# Patient Record
Sex: Male | Born: 1990 | Race: Black or African American | Hispanic: No | Marital: Single | Smoking: Current every day smoker
Health system: Southern US, Community
[De-identification: ages and names within clinical notes are randomized; demographics above are authoritative.]

## PROBLEM LIST (undated history)

## (undated) DIAGNOSIS — J45909 Unspecified asthma, uncomplicated: Secondary | ICD-10-CM

## (undated) DIAGNOSIS — J302 Other seasonal allergic rhinitis: Secondary | ICD-10-CM

---
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
## 2009-10-05 ENCOUNTER — Emergency Department (HOSPITAL_COMMUNITY): Admission: EM | Admit: 2009-10-05 | Discharge: 2009-10-05 | Payer: Self-pay | Admitting: Emergency Medicine

## 2011-03-31 LAB — GC/CHLAMYDIA PROBE AMP, GENITAL
Chlamydia, DNA Probe: NEGATIVE
GC Probe Amp, Genital: NEGATIVE

## 2011-05-05 ENCOUNTER — Emergency Department (HOSPITAL_COMMUNITY): Payer: Self-pay

## 2011-05-05 ENCOUNTER — Emergency Department (HOSPITAL_COMMUNITY)
Admission: EM | Admit: 2011-05-05 | Discharge: 2011-05-05 | Disposition: A | Discharge status: Home / Self Care | Payer: Self-pay | Attending: Emergency Medicine | Admitting: Emergency Medicine

## 2011-05-05 DIAGNOSIS — S81009A Unspecified open wound, unspecified knee, initial encounter: Secondary | ICD-10-CM | POA: Insufficient documentation

## 2011-05-05 DIAGNOSIS — M79609 Pain in unspecified limb: Secondary | ICD-10-CM | POA: Insufficient documentation

## 2011-05-05 DIAGNOSIS — W3400XA Accidental discharge from unspecified firearms or gun, initial encounter: Secondary | ICD-10-CM | POA: Insufficient documentation

## 2011-05-07 ENCOUNTER — Emergency Department (HOSPITAL_COMMUNITY)
Admission: EM | Admit: 2011-05-07 | Discharge: 2011-05-07 | Disposition: A | Discharge status: Home / Self Care | Payer: Self-pay | Attending: Emergency Medicine | Admitting: Emergency Medicine

## 2011-05-07 DIAGNOSIS — Z09 Encounter for follow-up examination after completed treatment for conditions other than malignant neoplasm: Secondary | ICD-10-CM | POA: Insufficient documentation

## 2012-02-29 ENCOUNTER — Emergency Department (HOSPITAL_COMMUNITY)
Admission: EM | Admit: 2012-02-29 | Discharge: 2012-03-01 | Disposition: A | Discharge status: Home / Self Care | Payer: Self-pay | Attending: Emergency Medicine | Admitting: Emergency Medicine

## 2012-02-29 ENCOUNTER — Encounter (HOSPITAL_COMMUNITY): Payer: Self-pay | Admitting: Emergency Medicine

## 2012-02-29 DIAGNOSIS — F17200 Nicotine dependence, unspecified, uncomplicated: Secondary | ICD-10-CM | POA: Insufficient documentation

## 2012-02-29 DIAGNOSIS — J45909 Unspecified asthma, uncomplicated: Secondary | ICD-10-CM

## 2012-02-29 DIAGNOSIS — J45901 Unspecified asthma with (acute) exacerbation: Secondary | ICD-10-CM | POA: Insufficient documentation

## 2012-02-29 HISTORY — DX: Other seasonal allergic rhinitis: J30.2

## 2012-02-29 HISTORY — DX: Unspecified asthma, uncomplicated: J45.909

## 2012-02-29 NOTE — ED Notes (Addendum)
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

## 2012-03-01 MED ORDER — PREDNISONE 20 MG PO TABS
60.0000 mg | ORAL_TABLET | Freq: Once | ORAL | Status: AC
  Administered 2012-03-01: 60 mg via ORAL
  Filled 2012-03-01: qty 3

## 2012-03-01 MED ORDER — PREDNISONE 20 MG PO TABS: 40.0000 mg | ORAL_TABLET | Freq: Every day | ORAL | Status: AC

## 2012-03-01 MED ORDER — ALBUTEROL SULFATE HFA 108 (90 BASE) MCG/ACT IN AERS
2.0000 | INHALATION_SPRAY | RESPIRATORY_TRACT | Status: DC | PRN
  Administered 2012-03-01: 2 via RESPIRATORY_TRACT
  Filled 2012-03-01: qty 6.7

## 2012-03-01 NOTE — ED Provider Notes (Addendum)
History     CSN: [id number]  Arrival date & time 02/29/12  2330   First MD Initiated Contact with Patient 02/29/12 2358      Chief Complaint  Patient presents with  . Asthma    (Consider location/radiation/quality/duration/timing/severity/associated sxs/prior treatment) Patient is a 21 y.o. male presenting with asthma. The history is provided by the patient.  Asthma This is a recurrent (Was at work and was not wearing the appropriate mask and developed wheezing and shortness of breath) problem. The current episode started 1 to 2 hours ago. The problem occurs constantly. The problem has not changed since onset.Associated symptoms include shortness of breath. Pertinent negatives include no chest pain and no abdominal pain. The symptoms are aggravated by walking. The symptoms are relieved by rest. He has tried nothing for the symptoms. The treatment provided no relief.    Past Medical History  Diagnosis Date  . Asthma   . Seasonal allergies     History reviewed. No pertinent past surgical history.  History reviewed. No pertinent family history.  History  Substance Use Topics  . Smoking status: Current Everyday Smoker -- 0.5 packs/day  . Smokeless tobacco: Not on file  . Alcohol Use: Yes     occasion      Review of Systems  HENT: Negative for congestion, sore throat and rhinorrhea.   Respiratory: Positive for cough and shortness of breath.   Cardiovascular: Negative for chest pain.  Gastrointestinal: Negative for abdominal pain.  All other systems reviewed and are negative.    Allergies  Review of patient's allergies indicates no known allergies.  Home Medications  No current outpatient prescriptions on file.  BP 118/64  Pulse 91  Temp 98.5 F (36.9 C)  Resp 17  SpO2 98%  Physical Exam  Nursing note and vitals reviewed. Constitutional: He is oriented to person, place, and time. He appears well-developed and well-nourished. No distress.  HENT:  Head:  Normocephalic and atraumatic.  Right Ear: External ear normal.  Left Ear: External ear normal.  Mouth/Throat: Oropharynx is clear and moist.  Eyes: Conjunctivae and EOM are normal. Pupils are equal, round, and reactive to light. Right eye exhibits no discharge.  Neck: Normal range of motion. Neck supple.  Cardiovascular: Normal rate, regular rhythm, normal heart sounds and intact distal pulses.   No murmur heard. Pulmonary/Chest: Effort normal. No respiratory distress. He has no decreased breath sounds. He has wheezes. He has no rhonchi. He has no rales.  Abdominal: Soft. He exhibits no distension. There is no tenderness. There is no rebound and no guarding.  Musculoskeletal: Normal range of motion. He exhibits no edema and no tenderness.  Neurological: He is alert and oriented to person, place, and time.  Skin: Skin is warm and dry. No rash noted. No erythema.  Psychiatric: He has a normal mood and affect. His behavior is normal.    ED Course  Procedures (including critical care time)  Labs Reviewed - No data to display No results found.   1. Asthma       MDM   Pt with typical asthma exacerbation  symptoms.  No infectious sx, productive cough or other complaints.  Wheezing on exam.  will give steroids, albuterol hfa.  12:48 AM Wheezing resolved       Gwyneth Sprout, MD 03/01/12 0010  Gwyneth Sprout, MD 03/01/12 [phone]

## 2012-03-01 NOTE — ED Notes (Signed)
Pt states he began wheezing while at work, works with live Programme researcher, broadcasting/film/video.  States he has not had an asthma attack in years and has run out of rescue inhaler.

## 2012-03-01 NOTE — ED Notes (Signed)
rx x 1, pt voiced understanding to f/u with PCP and return for worsening of condition.

## 2012-05-27 ENCOUNTER — Encounter (HOSPITAL_COMMUNITY): Payer: Self-pay | Admitting: *Deleted

## 2012-05-27 ENCOUNTER — Emergency Department (HOSPITAL_COMMUNITY)
Admission: EM | Admit: 2012-05-27 | Discharge: 2012-05-27 | Disposition: A | Discharge status: Home / Self Care | Payer: Self-pay | Attending: Emergency Medicine | Admitting: Emergency Medicine

## 2012-05-27 DIAGNOSIS — R0602 Shortness of breath: Secondary | ICD-10-CM | POA: Insufficient documentation

## 2012-05-27 DIAGNOSIS — J45909 Unspecified asthma, uncomplicated: Secondary | ICD-10-CM

## 2012-05-27 DIAGNOSIS — J45901 Unspecified asthma with (acute) exacerbation: Secondary | ICD-10-CM | POA: Insufficient documentation

## 2012-05-27 DIAGNOSIS — F17200 Nicotine dependence, unspecified, uncomplicated: Secondary | ICD-10-CM | POA: Insufficient documentation

## 2012-05-27 MED ORDER — ALBUTEROL SULFATE (5 MG/ML) 0.5% IN NEBU
5.0000 mg | INHALATION_SOLUTION | Freq: Once | RESPIRATORY_TRACT | Status: AC
  Administered 2012-05-27: 5 mg via RESPIRATORY_TRACT

## 2012-05-27 MED ORDER — PREDNISONE 10 MG PO TABS: 50.0000 mg | ORAL_TABLET | Freq: Every day | ORAL | Status: DC

## 2012-05-27 MED ORDER — ALBUTEROL SULFATE (5 MG/ML) 0.5% IN NEBU
INHALATION_SOLUTION | RESPIRATORY_TRACT | Status: AC
  Administered 2012-05-27: 5 mg via RESPIRATORY_TRACT
  Filled 2012-05-27: qty 1

## 2012-05-27 MED ORDER — ALBUTEROL SULFATE (5 MG/ML) 0.5% IN NEBU
5.0000 mg | INHALATION_SOLUTION | Freq: Once | RESPIRATORY_TRACT | Status: AC
  Administered 2012-05-27: 5 mg via RESPIRATORY_TRACT
  Filled 2012-05-27: qty 1

## 2012-05-27 MED ORDER — ALBUTEROL SULFATE HFA 108 (90 BASE) MCG/ACT IN AERS: 1.0000 | INHALATION_SPRAY | Freq: Four times a day (QID) | RESPIRATORY_TRACT | Status: DC | PRN

## 2012-05-27 MED ORDER — MAGNESIUM SULFATE 40 MG/ML IJ SOLN
2.0000 g | Freq: Once | INTRAMUSCULAR | Status: AC
  Administered 2012-05-27: 2 g via INTRAVENOUS
  Filled 2012-05-27: qty 50

## 2012-05-27 MED ORDER — SODIUM CHLORIDE 0.9 % IV BOLUS (SEPSIS)
1000.0000 mL | Freq: Once | INTRAVENOUS | Status: AC
  Administered 2012-05-27: 1000 mL via INTRAVENOUS

## 2012-05-27 MED ORDER — PREDNISONE 20 MG PO TABS
60.0000 mg | ORAL_TABLET | Freq: Once | ORAL | Status: AC
  Administered 2012-05-27: 60 mg via ORAL
  Filled 2012-05-27: qty 3

## 2012-05-27 NOTE — ED Provider Notes (Addendum)
History     CSN: [id number]  Arrival date & time 05/27/12  6295   First MD Initiated Contact with Patient 05/27/12 0303      Chief Complaint  Patient presents with  . Asthma  . Shortness of Breath    (Consider location/radiation/quality/duration/timing/severity/associated sxs/prior treatment) Patient is a 21 y.o. male presenting with asthma and shortness of breath. The history is provided by the patient.  Asthma This is a new problem. The current episode started 3 to 5 hours ago. The problem occurs constantly. The problem has not changed since onset.Associated symptoms include shortness of breath.  Shortness of Breath  Associated symptoms include shortness of breath. His past medical history is significant for asthma.    Past Medical History  Diagnosis Date  . Asthma   . Seasonal allergies     History reviewed. No pertinent past surgical history.  No family history on file.  History  Substance Use Topics  . Smoking status: Current Every Day Smoker -- 0.5 packs/day  . Smokeless tobacco: Not on file  . Alcohol Use: Yes     Comment: occasion      Review of Systems  Respiratory: Positive for shortness of breath.   All other systems reviewed and are negative.    Allergies  Review of patient's allergies indicates no known allergies.  Home Medications  No current outpatient prescriptions on file.  BP 130/74  Pulse 88  Temp 98.6 F (37 C) (Oral)  Resp 26  SpO2 92%  Physical Exam  Constitutional: He is oriented to person, place, and time. He appears well-developed and well-nourished.  HENT:  Head: Normocephalic and atraumatic.  Eyes: Conjunctivae normal are normal. Pupils are equal, round, and reactive to light.  Neck: Normal range of motion. Neck supple.  Cardiovascular: Normal rate, regular rhythm, normal heart sounds and intact distal pulses.   Pulmonary/Chest: Effort normal. He has wheezes.  Abdominal: Soft. Bowel sounds are normal.  Neurological: He  is alert and oriented to person, place, and time.  Skin: Skin is warm and dry.  Psychiatric: He has a normal mood and affect. His behavior is normal. Judgment and thought content normal.    ED Course  Procedures (including critical care time)  Labs Reviewed - No data to display No results found.   No diagnosis found.    MDM  Hx of asthma out of inhaled ibr,  Will ibr,  Steriods,  reassess  improved.  Will dc to outpt fu.        Orian Figueira Lytle Michaels, MD 05/27/12 2841  Rosanne Ashing, MD 05/27/12 [phone]

## 2012-05-27 NOTE — ED Notes (Signed)
C/o sob, cough, wheezing. Onset tonight. Relates sx to asthma, denies fever. Has been working in a lot of dust.

## 2012-05-27 NOTE — ED Notes (Signed)
NOT READY FOR DISCHARGE AT THIS TIME , NEBULIZER TREATMENT IN PROGRESS.

## 2012-12-23 ENCOUNTER — Encounter (HOSPITAL_COMMUNITY): Payer: Self-pay | Admitting: *Deleted

## 2012-12-23 ENCOUNTER — Emergency Department (HOSPITAL_COMMUNITY)
Admission: EM | Admit: 2012-12-23 | Discharge: 2012-12-23 | Disposition: A | Discharge status: Home / Self Care | Payer: No Typology Code available for payment source | Attending: Emergency Medicine | Admitting: Emergency Medicine

## 2012-12-23 DIAGNOSIS — Y9389 Activity, other specified: Secondary | ICD-10-CM | POA: Insufficient documentation

## 2012-12-23 DIAGNOSIS — J45909 Unspecified asthma, uncomplicated: Secondary | ICD-10-CM | POA: Insufficient documentation

## 2012-12-23 DIAGNOSIS — F17200 Nicotine dependence, unspecified, uncomplicated: Secondary | ICD-10-CM | POA: Insufficient documentation

## 2012-12-23 DIAGNOSIS — G8929 Other chronic pain: Secondary | ICD-10-CM

## 2012-12-23 DIAGNOSIS — IMO0002 Reserved for concepts with insufficient information to code with codable children: Secondary | ICD-10-CM | POA: Insufficient documentation

## 2012-12-23 DIAGNOSIS — Y92410 Unspecified street and highway as the place of occurrence of the external cause: Secondary | ICD-10-CM | POA: Insufficient documentation

## 2012-12-23 MED ORDER — IBUPROFEN 800 MG PO TABS: 800.0000 mg | ORAL_TABLET | Freq: Three times a day (TID) | ORAL | Status: DC

## 2012-12-23 MED ORDER — DIAZEPAM 5 MG PO TABS: ORAL_TABLET | ORAL | Status: DC

## 2012-12-23 MED ORDER — IBUPROFEN 400 MG PO TABS
800.0000 mg | ORAL_TABLET | Freq: Once | ORAL | Status: AC
  Administered 2012-12-23: 800 mg via ORAL
  Filled 2012-12-23: qty 2
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
## 2012-12-24 NOTE — ED Provider Notes (Signed)
Medical screening examination/treatment/procedure(s) were performed by non-physician practitioner and as supervising physician I was immediately available for consultation/collaboration.   Carleene Cooper III, MD 12/24/12 [phone]

## 2014-08-12 ENCOUNTER — Encounter (HOSPITAL_COMMUNITY): Payer: Self-pay | Admitting: Physical Medicine and Rehabilitation

## 2014-08-12 ENCOUNTER — Emergency Department (HOSPITAL_COMMUNITY): Payer: Self-pay

## 2014-08-12 ENCOUNTER — Emergency Department (HOSPITAL_COMMUNITY)
Admission: EM | Admit: 2014-08-12 | Discharge: 2014-08-12 | Disposition: A | Discharge status: Home / Self Care | Payer: Self-pay | Attending: Emergency Medicine | Admitting: Emergency Medicine

## 2014-08-12 DIAGNOSIS — J069 Acute upper respiratory infection, unspecified: Secondary | ICD-10-CM | POA: Insufficient documentation

## 2014-08-12 DIAGNOSIS — Z791 Long term (current) use of non-steroidal anti-inflammatories (NSAID): Secondary | ICD-10-CM | POA: Insufficient documentation

## 2014-08-12 DIAGNOSIS — Z720 Tobacco use: Secondary | ICD-10-CM | POA: Insufficient documentation

## 2014-08-12 DIAGNOSIS — J45901 Unspecified asthma with (acute) exacerbation: Secondary | ICD-10-CM | POA: Insufficient documentation

## 2014-08-12 MED ORDER — ALBUTEROL SULFATE HFA 108 (90 BASE) MCG/ACT IN AERS: 1.0000 | INHALATION_SPRAY | Freq: Four times a day (QID) | RESPIRATORY_TRACT | Status: DC | PRN

## 2014-08-12 MED ORDER — AZITHROMYCIN 250 MG PO TABS: 250.0000 mg | ORAL_TABLET | Freq: Every day | ORAL | Status: DC

## 2014-08-12 MED ORDER — IPRATROPIUM-ALBUTEROL 0.5-2.5 (3) MG/3ML IN SOLN
3.0000 mL | Freq: Once | RESPIRATORY_TRACT | Status: AC
  Administered 2014-08-12: 3 mL via RESPIRATORY_TRACT
  Filled 2014-08-12: qty 3

## 2014-08-12 NOTE — Discharge Instructions (Signed)
Use inhaler as needed for wheezing and shortness of breath. Take azithromycin as directed until gone. Refer to attached documents for more information.

## 2014-08-12 NOTE — ED Provider Notes (Signed)
CSN: [id number]     Arrival date & time 08/12/14  1531 History  This chart was scribed for non-physician practitioner, Emilia Beck, PA-C working with Tilden Fossa, MD by Greggory Stallion, ED scribe. This patient was seen in room TR07C/TR07C and the patient's care was started at 5:23 PM.    Chief Complaint  Patient presents with  . Cough  . Nasal Congestion   The history is provided by the patient. No language interpreter was used.    HPI Comments: Cory Adams is a 24 y.o. male with history of asthma and seasonal allergies who presents to the Emergency Department complaining of productive cough of yellow and white sputum with associated intermittent SOB that started 2-3 days ago. Pt has used cough drops and tussinex with no relief. He does not have an inhaler at home. His mother had similar symptoms a few weeks ago but denies other sick contacts. Pt denies fever, nasal congestion, sore throat.   Past Medical History  Diagnosis Date  . Asthma   . Seasonal allergies    History reviewed. No pertinent past surgical history. History reviewed. No pertinent family history. History  Substance Use Topics  . Smoking status: Current Every Day Smoker -- 0.50 packs/day  . Smokeless tobacco: Not on file  . Alcohol Use: Yes     Comment: social    Review of Systems  Constitutional: Negative for fever.  HENT: Negative for congestion and sore throat.   Respiratory: Positive for cough and shortness of breath.   All other systems reviewed and are negative.  Allergies  Review of patient's allergies indicates no known allergies.  Home Medications   Prior to Admission medications   Medication Sig Start Date End Date Taking? Authorizing Provider  diazepam (VALIUM) 5 MG tablet Take one tablet by mouth at bedtime for a muscle relaxer 12/23/12   Glade Nurse, PA-C  ibuprofen (ADVIL,MOTRIN) 800 MG tablet Take 1 tablet (800 mg total) by mouth 3 (three) times daily. 12/23/12   Glade Nurse, PA-C    BP 146/67 mmHg  Pulse 87  Temp(Src) 98.2 F (36.8 C) (Oral)  Resp 18  SpO2 95%   Physical Exam  Constitutional: He is oriented to person, place, and time. He appears well-developed and well-nourished. No distress.  HENT:  Head: Normocephalic and atraumatic.  Mouth/Throat: Oropharynx is clear and moist.  Eyes: Conjunctivae and EOM are normal.  Neck: Neck supple. No tracheal deviation present.  Cardiovascular: Normal rate, regular rhythm and normal heart sounds.   Pulmonary/Chest: Effort normal. No respiratory distress. He has wheezes. He has no rhonchi. He has no rales.  Inspiratory and expiratory wheezing in bilateral lower lobes.   Abdominal: Soft. He exhibits no distension. There is no tenderness.  Musculoskeletal: Normal range of motion.  Neurological: He is alert and oriented to person, place, and time.  Skin: Skin is warm and dry.  Psychiatric: He has a normal mood and affect. His behavior is normal.  Nursing note and vitals reviewed.   ED Course  Procedures (including critical care time)  DIAGNOSTIC STUDIES: Oxygen Saturation is 95% on RA, adequate by my interpretation.    COORDINATION OF CARE: 5:27 PM-Discussed treatment plan which includes an inhaler and antibiotic with pt at bedside and pt agreed to plan.   Labs Review Labs Reviewed - No data to display  Imaging Review Dg Chest 2 View  08/12/2014   CLINICAL DATA:  Shortness of breath and productive cough for 4 days. History of asthma and smoker. Nasal  congestion.  EXAM: CHEST  2 VIEW  COMPARISON:  None.  FINDINGS: Mild hyperinflation interstitial changes compatible with history of asthma. No focal airspace disease or consolidation in the lungs. No blunting of costophrenic angles. No pneumothorax. Normal heart size and pulmonary vascularity.  IMPRESSION: No active cardiopulmonary disease.   Electronically Signed   By: Burman NievesWilliam  Stevens M.D.   On: 08/12/2014 17:07     EKG Interpretation None      MDM   Final  diagnoses:  URI (upper respiratory infection)    5:33 PM Chest xray unremarkable for acute changes. Breath sounds improved after nebulizer treatment. Patient will be discharged with azithromycin and albuterol. Vitals stable and patient afebrile.   I personally performed the services described in this documentation, which was scribed in my presence. The recorded information has been reviewed and is accurate.  Emilia BeckKaitlyn Daaiyah Baumert, PA-C 08/12/14 1734  Tilden FossaElizabeth Rees, MD 08/12/14 [phone]

## 2014-08-12 NOTE — ED Notes (Signed)
Pt presents to department for evaluation of productive cough with yellow sputum, also states sinus/chest congestion. Respirations unlabored. NAD.

## 2014-08-12 NOTE — ED Notes (Signed)
Declined W/C at D/C and was escorted to lobby by RN. 

## 2015-02-24 ENCOUNTER — Encounter (HOSPITAL_COMMUNITY): Payer: Self-pay | Admitting: *Deleted

## 2015-02-24 ENCOUNTER — Other Ambulatory Visit: Payer: Self-pay

## 2015-02-24 DIAGNOSIS — Z77098 Contact with and (suspected) exposure to other hazardous, chiefly nonmedicinal, chemicals: Secondary | ICD-10-CM | POA: Insufficient documentation

## 2015-02-24 DIAGNOSIS — J45909 Unspecified asthma, uncomplicated: Secondary | ICD-10-CM | POA: Insufficient documentation

## 2015-02-24 DIAGNOSIS — Z720 Tobacco use: Secondary | ICD-10-CM | POA: Insufficient documentation

## 2015-02-24 DIAGNOSIS — R42 Dizziness and giddiness: Secondary | ICD-10-CM | POA: Insufficient documentation

## 2015-02-24 DIAGNOSIS — Z791 Long term (current) use of non-steroidal anti-inflammatories (NSAID): Secondary | ICD-10-CM | POA: Insufficient documentation

## 2015-02-24 DIAGNOSIS — Z79899 Other long term (current) drug therapy: Secondary | ICD-10-CM | POA: Insufficient documentation

## 2015-02-24 LAB — BASIC METABOLIC PANEL
Anion gap: 6 (ref 5–15)
BUN: 10 mg/dL (ref 6–20)
CALCIUM: 9 mg/dL (ref 8.9–10.3)
CHLORIDE: 104 mmol/L (ref 101–111)
CO2: 28 mmol/L (ref 22–32)
CREATININE: 0.96 mg/dL (ref 0.61–1.24)
Glucose, Bld: 116 mg/dL — ABNORMAL HIGH (ref 65–99)
Potassium: 3.3 mmol/L — ABNORMAL LOW (ref 3.5–5.1)
SODIUM: 138 mmol/L (ref 135–145)

## 2015-02-24 LAB — CBC WITH DIFFERENTIAL/PLATELET
BASOS PCT: 1 % (ref 0–1)
Basophils Absolute: 0.1 10*3/uL (ref 0.0–0.1)
EOS ABS: 0.3 10*3/uL (ref 0.0–0.7)
EOS PCT: 4 % (ref 0–5)
HCT: 35.6 % — ABNORMAL LOW (ref 39.0–52.0)
HEMOGLOBIN: 12.6 g/dL — AB (ref 13.0–17.0)
LYMPHS ABS: 2.5 10*3/uL (ref 0.7–4.0)
Lymphocytes Relative: 42 % (ref 12–46)
MCH: 30.7 pg (ref 26.0–34.0)
MCHC: 35.4 g/dL (ref 30.0–36.0)
MCV: 86.8 fL (ref 78.0–100.0)
MONOS PCT: 9 % (ref 3–12)
Monocytes Absolute: 0.5 10*3/uL (ref 0.1–1.0)
NEUTROS PCT: 44 % (ref 43–77)
Neutro Abs: 2.6 10*3/uL (ref 1.7–7.7)
PLATELETS: 215 10*3/uL (ref 150–400)
RBC: 4.1 MIL/uL — ABNORMAL LOW (ref 4.22–5.81)
RDW: 12.7 % (ref 11.5–15.5)
WBC: 6 10*3/uL (ref 4.0–10.5)

## 2015-02-24 LAB — I-STAT TROPONIN, ED: TROPONIN I, POC: 0 ng/mL (ref 0.00–0.08)

## 2015-02-24 LAB — CARBOXYHEMOGLOBIN
CARBOXYHEMOGLOBIN: 5.6 % — AB (ref 0.5–1.5)
Methemoglobin: 1.7 % — ABNORMAL HIGH (ref 0.0–1.5)
O2 SAT: 99.4 %
TOTAL HEMOGLOBIN: 12.7 g/dL — AB (ref 13.5–18.0)
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
## 2015-02-24 NOTE — ED Notes (Signed)
Nonrebreather placed on pt

## 2015-02-25 ENCOUNTER — Emergency Department (HOSPITAL_COMMUNITY)
Admission: EM | Admit: 2015-02-25 | Discharge: 2015-02-25 | Disposition: A | Discharge status: Home / Self Care | Payer: Self-pay | Attending: Emergency Medicine | Admitting: Emergency Medicine

## 2015-02-25 ENCOUNTER — Encounter (HOSPITAL_COMMUNITY): Payer: Self-pay | Admitting: Emergency Medicine

## 2015-02-25 DIAGNOSIS — Z7729 Contact with and (suspected ) exposure to other hazardous substances: Secondary | ICD-10-CM

## 2015-02-25 LAB — RAPID URINE DRUG SCREEN, HOSP PERFORMED
Amphetamines: NOT DETECTED
BARBITURATES: NOT DETECTED
BENZODIAZEPINES: NOT DETECTED
Cocaine: NOT DETECTED
Opiates: NOT DETECTED
Tetrahydrocannabinol: NOT DETECTED

## 2015-02-25 NOTE — Discharge Instructions (Signed)
Carboxyhemoglobin, COHb °This is a blood test used to determine carbon monoxide poisoning. It measures the amount of serum COHb, which is formed when the hemoglobin (oxygen carrying part of the blood) combines with carbon monoxide.  °PREPARATION FOR TEST °No preparation or fasting is necessary. °NORMAL FINDINGS °· Adults: <2.3% (0.023) °· Adult smokers: 2.1% - 4.2% (0.021 - 0.042) °· Adult heavy smokers (more than 2 packs/day): 8% - 9% (0.08 - 0.09) °· Hemolytic anemia: Up to 4% °¨ Newborn: greater than 12% °Ranges for normal findings may vary among different laboratories and hospitals. You should always check with your doctor after having lab work or other tests done to discuss the meaning of your test results and whether your values are considered within normal limits. °MEANING OF TEST  °Your caregiver will go over the test results with you and discuss the importance and meaning of your results, as well as treatment options and the need for additional tests if necessary. °OBTAINING THE TEST RESULTS °It is your responsibility to obtain your test results. Ask the lab or department performing the test when and how you will get your results. °Document Released: 07/14/2004 Document Revised: 09/14/2011 Document Reviewed: 05/30/2008 °[employer]® Patient Information ©2015 [employer]. This information is not intended to replace advice given to you by your health care provider. Make sure you discuss any questions you have with your health care provider. ° °

## 2015-02-25 NOTE — ED Notes (Addendum)
Called poison control spoke with Cory Adams, reported labs and situation.  Poison control said that protocol is to have on high flow oxygen for 4 hours.  If vitals are stable pt can go home.  Pt arrived at 2218 and has been on non-rebreather.  Dr Nicanor Alcon notified.  PO fluid challenged with no issues or side effects.

## 2015-02-25 NOTE — ED Provider Notes (Signed)
CSN: [id number]     Arrival date & time 02/24/15  2213 History   This chart was scribed for Samariah Hokenson, MD by Arlan Organ, ED Scribe. This patient was seen in room B19C/B19C and the patient's care was started 1:35 AM.   No chief complaint on file.  The history is provided by the patient. No language interpreter was used.    HPI Comments: Cory Adams is a 24 y.o. male with a PMHx of asthma who presents to the Emergency Department here for carbon monoxide exposure this evening. Pt states the gas stove had been on since 1500 as his girlfriend was cooking. Pt took a nap at approximately 1400 for 4-5 hours. Carbon monoxide alarm went off at approximately 1900. Pt states he experienced some dizziness but states this has improved. No recent fever or chills. No known allergies to medications.   Past Medical History  Diagnosis Date  . Asthma   . Seasonal allergies    History reviewed. No pertinent past surgical history. No family history on file. Social History  Substance Use Topics  . Smoking status: Current Every Day Smoker -- 0.50 packs/day  . Smokeless tobacco: None  . Alcohol Use: Yes     Comment: social    Review of Systems  Constitutional: Negative for fever and chills.  Respiratory: Negative for cough and shortness of breath.   Cardiovascular: Negative for chest pain, palpitations and leg swelling.  Gastrointestinal: Negative for nausea, vomiting, abdominal pain and diarrhea.  Skin: Negative for rash.  Neurological: Positive for dizziness. Negative for tremors, seizures, syncope, speech difficulty, weakness, light-headedness, numbness and headaches.  Psychiatric/Behavioral: Negative for confusion.  All other systems reviewed and are negative.     Allergies  Review of patient's allergies indicates no known allergies.  Home Medications   Prior to Admission medications   Medication Sig Start Date End Date Taking? Authorizing Provider  albuterol (PROVENTIL  HFA;VENTOLIN HFA) 108 (90 BASE) MCG/ACT inhaler Inhale 1-2 puffs into the lungs every 6 (six) hours as needed for wheezing or shortness of breath. 08/12/14   Kaitlyn Szekalski, PA-C  azithromycin (ZITHROMAX Z-PAK) 250 MG tablet Take 1 tablet (250 mg total) by mouth daily. 500mg  PO day 1, then 250mg  PO days 205 08/12/14   Emilia Beck, PA-C  diazepam (VALIUM) 5 MG tablet Take one tablet by mouth at bedtime for a muscle relaxer 12/23/12   Lowell Bouton, PA-C  ibuprofen (ADVIL,MOTRIN) 800 MG tablet Take 1 tablet (800 mg total) by mouth 3 (three) times daily. 12/23/12   Lowell Bouton, PA-C   Triage Vitals: BP 117/66 mmHg  Pulse 65  Temp(Src) 98.3 F (36.8 C) (Oral)  Resp 16  Ht 6\' 2"  (1.88 m)  Wt 198 lb (89.812 kg)  BMI 25.41 kg/m2  SpO2 98%   Physical Exam  Constitutional: He is oriented to person, place, and time. He appears well-developed and well-nourished.  HENT:  Head: Normocephalic and atraumatic.  Mouth/Throat: Oropharynx is clear and moist. No oropharyngeal exudate.  No cherry red mucous membranes   Eyes: EOM are normal. Pupils are equal, round, and reactive to light.  Neck: Normal range of motion.  Cardiovascular: Normal rate, regular rhythm, normal heart sounds and intact distal pulses.   Pulmonary/Chest: Effort normal and breath sounds normal. No stridor. No respiratory distress. He has no wheezes. He has no rales.  Abdominal: Soft. Bowel sounds are normal. He exhibits no distension and no mass. There is no tenderness. There is no rebound and no  guarding.  Musculoskeletal: Normal range of motion.  Lymphadenopathy:    He has no cervical adenopathy.  Neurological: He is alert and oriented to person, place, and time. He has normal reflexes.  Skin: Skin is warm and dry.  Psychiatric: He has a normal mood and affect. Judgment normal.  Nursing note and vitals reviewed.   ED Course  Procedures (including critical care time)  DIAGNOSTIC STUDIES: Oxygen Saturation is 98% on RA,  Normal by my interpretation.    COORDINATION OF CARE: 1:39 AM- Will blood work and EKG. Discussed treatment plan with pt at bedside and pt agreed to plan.     Labs Review Labs Reviewed  CARBOXYHEMOGLOBIN - Abnormal; Notable for the following:    Total hemoglobin 12.7 (*)    Carboxyhemoglobin 5.6 (*)    Methemoglobin 1.7 (*)    All other components within normal limits  CBC WITH DIFFERENTIAL/PLATELET - Abnormal; Notable for the following:    RBC 4.10 (*)    Hemoglobin 12.6 (*)    HCT 35.6 (*)    All other components within normal limits  BASIC METABOLIC PANEL - Abnormal; Notable for the following:    Potassium 3.3 (*)    Glucose, Bld 116 (*)    All other components within normal limits  I-STAT TROPOININ, ED    Imaging Review No results found. I have personally reviewed and evaluated these images and lab results as part of my medical decision-making.   EKG Interpretation None      EKG Interpretation  Date/Time:  Sunday February 24 2015 22:35:27 EDT Ventricular Rate:  55 PR Interval:  146 QRS Duration: 96 QT Interval:  382 QTC Calculation: 365 R Axis:   77 Text Interpretation:  Sinus bradycardia Confirmed by [facility]  MD, Linnie Mcglocklin ([id number]) on 02/25/2015 2:00:49 AM       MDM   Final diagnoses:  None    Well appearing.  See poison control note.  PO challenged safe for discharge  I personally performed the services described in this documentation, which was scribed in my presence. The recorded information has been reviewed and is accurate.    Cy Blamer, MD 02/25/15 [phone]

## 2015-02-25 NOTE — ED Notes (Signed)
Pt stable, ambulatory, states understanding of discharge instructions 

## 2015-06-28 ENCOUNTER — Emergency Department (HOSPITAL_COMMUNITY): Payer: Self-pay

## 2015-06-28 ENCOUNTER — Emergency Department (HOSPITAL_COMMUNITY)
Admission: EM | Admit: 2015-06-28 | Discharge: 2015-06-28 | Disposition: A | Discharge status: Home / Self Care | Payer: Self-pay | Attending: Emergency Medicine | Admitting: Emergency Medicine

## 2015-06-28 ENCOUNTER — Encounter (HOSPITAL_COMMUNITY): Payer: Self-pay | Admitting: Emergency Medicine

## 2015-06-28 DIAGNOSIS — T148XXA Other injury of unspecified body region, initial encounter: Secondary | ICD-10-CM

## 2015-06-28 DIAGNOSIS — S3992XA Unspecified injury of lower back, initial encounter: Secondary | ICD-10-CM | POA: Insufficient documentation

## 2015-06-28 DIAGNOSIS — F17200 Nicotine dependence, unspecified, uncomplicated: Secondary | ICD-10-CM | POA: Insufficient documentation

## 2015-06-28 DIAGNOSIS — Y9389 Activity, other specified: Secondary | ICD-10-CM | POA: Insufficient documentation

## 2015-06-28 DIAGNOSIS — Y92410 Unspecified street and highway as the place of occurrence of the external cause: Secondary | ICD-10-CM | POA: Insufficient documentation

## 2015-06-28 DIAGNOSIS — Y999 Unspecified external cause status: Secondary | ICD-10-CM | POA: Insufficient documentation

## 2015-06-28 DIAGNOSIS — S199XXA Unspecified injury of neck, initial encounter: Secondary | ICD-10-CM | POA: Insufficient documentation

## 2015-06-28 DIAGNOSIS — J45909 Unspecified asthma, uncomplicated: Secondary | ICD-10-CM | POA: Insufficient documentation

## 2015-06-28 MED ORDER — HYDROCODONE-ACETAMINOPHEN 5-325 MG PO TABS: ORAL_TABLET | ORAL | Status: AC
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
## 2015-06-28 NOTE — ED Notes (Signed)
Pt is in stable condition upon d/c and ambulates from ED. 

## 2015-06-29 ENCOUNTER — Emergency Department (HOSPITAL_COMMUNITY)
Admission: EM | Admit: 2015-06-29 | Discharge: 2015-06-29 | Disposition: A | Discharge status: Home / Self Care | Payer: Self-pay | Attending: Emergency Medicine | Admitting: Emergency Medicine

## 2015-06-29 ENCOUNTER — Encounter (HOSPITAL_COMMUNITY): Payer: Self-pay

## 2015-06-29 DIAGNOSIS — S39012A Strain of muscle, fascia and tendon of lower back, initial encounter: Secondary | ICD-10-CM | POA: Insufficient documentation

## 2015-06-29 DIAGNOSIS — F17200 Nicotine dependence, unspecified, uncomplicated: Secondary | ICD-10-CM | POA: Insufficient documentation

## 2015-06-29 DIAGNOSIS — Y92410 Unspecified street and highway as the place of occurrence of the external cause: Secondary | ICD-10-CM | POA: Insufficient documentation

## 2015-06-29 DIAGNOSIS — Y998 Other external cause status: Secondary | ICD-10-CM | POA: Insufficient documentation

## 2015-06-29 DIAGNOSIS — R519 Headache, unspecified: Secondary | ICD-10-CM

## 2015-06-29 DIAGNOSIS — S0990XA Unspecified injury of head, initial encounter: Secondary | ICD-10-CM | POA: Insufficient documentation

## 2015-06-29 DIAGNOSIS — J45909 Unspecified asthma, uncomplicated: Secondary | ICD-10-CM | POA: Insufficient documentation

## 2015-06-29 DIAGNOSIS — S39012D Strain of muscle, fascia and tendon of lower back, subsequent encounter: Secondary | ICD-10-CM

## 2015-06-29 DIAGNOSIS — Y9389 Activity, other specified: Secondary | ICD-10-CM | POA: Insufficient documentation

## 2015-06-29 DIAGNOSIS — R51 Headache: Secondary | ICD-10-CM

## 2015-06-29 MED ORDER — KETOROLAC TROMETHAMINE 60 MG/2ML IM SOLN
60.0000 mg | Freq: Once | INTRAMUSCULAR | Status: AC
  Administered 2015-06-29: 60 mg via INTRAMUSCULAR
  Filled 2015-06-29: qty 2
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
## 2015-08-15 IMAGING — DX DG CHEST 2V
2 series · 2 of 2 positions shown · non-contrast
Comparison: None.

CLINICAL DATA: Shortness of breath and productive cough for 4 days.
History of asthma and smoker. Nasal congestion.

EXAM:
CHEST  2 VIEW

[chest pa]
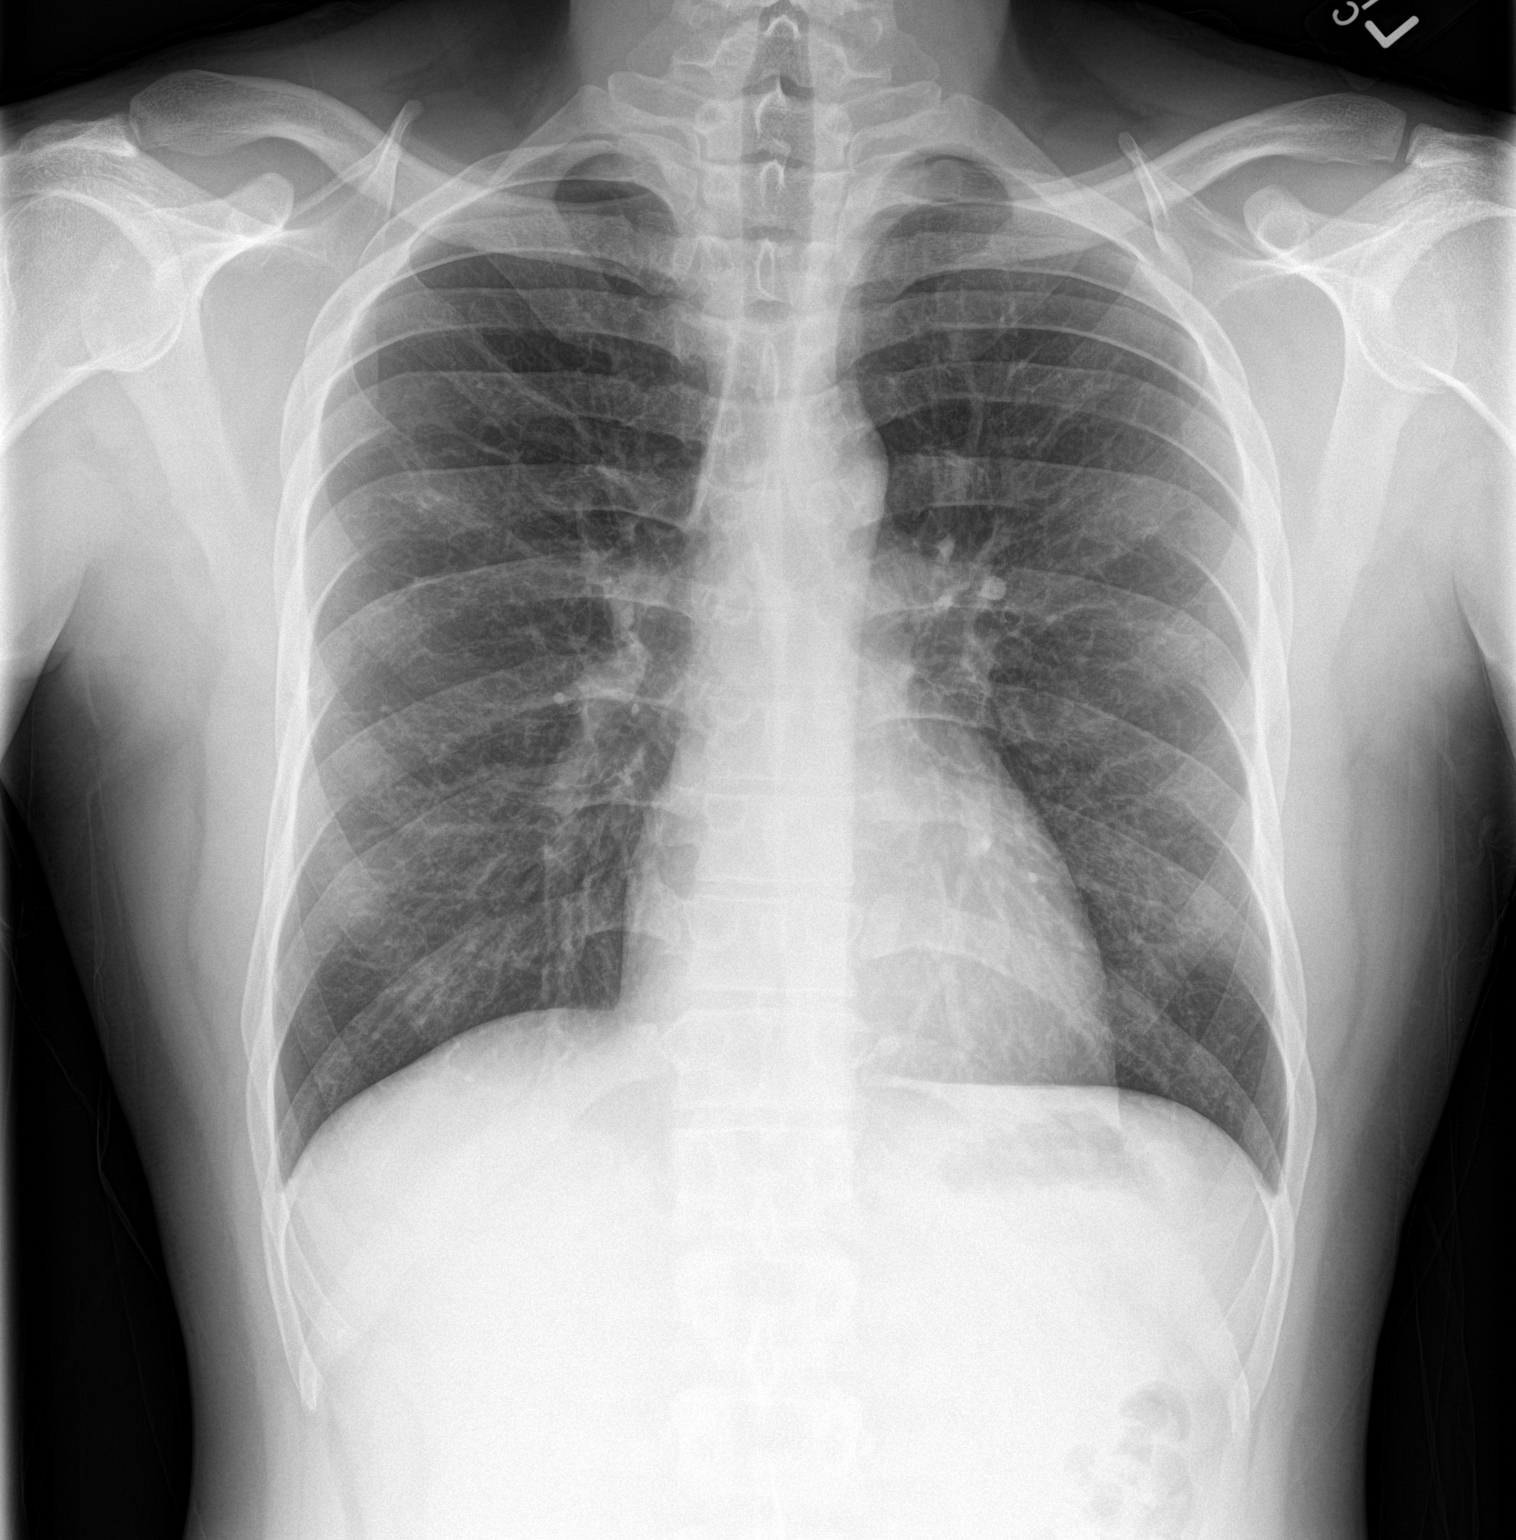

[chest lat]
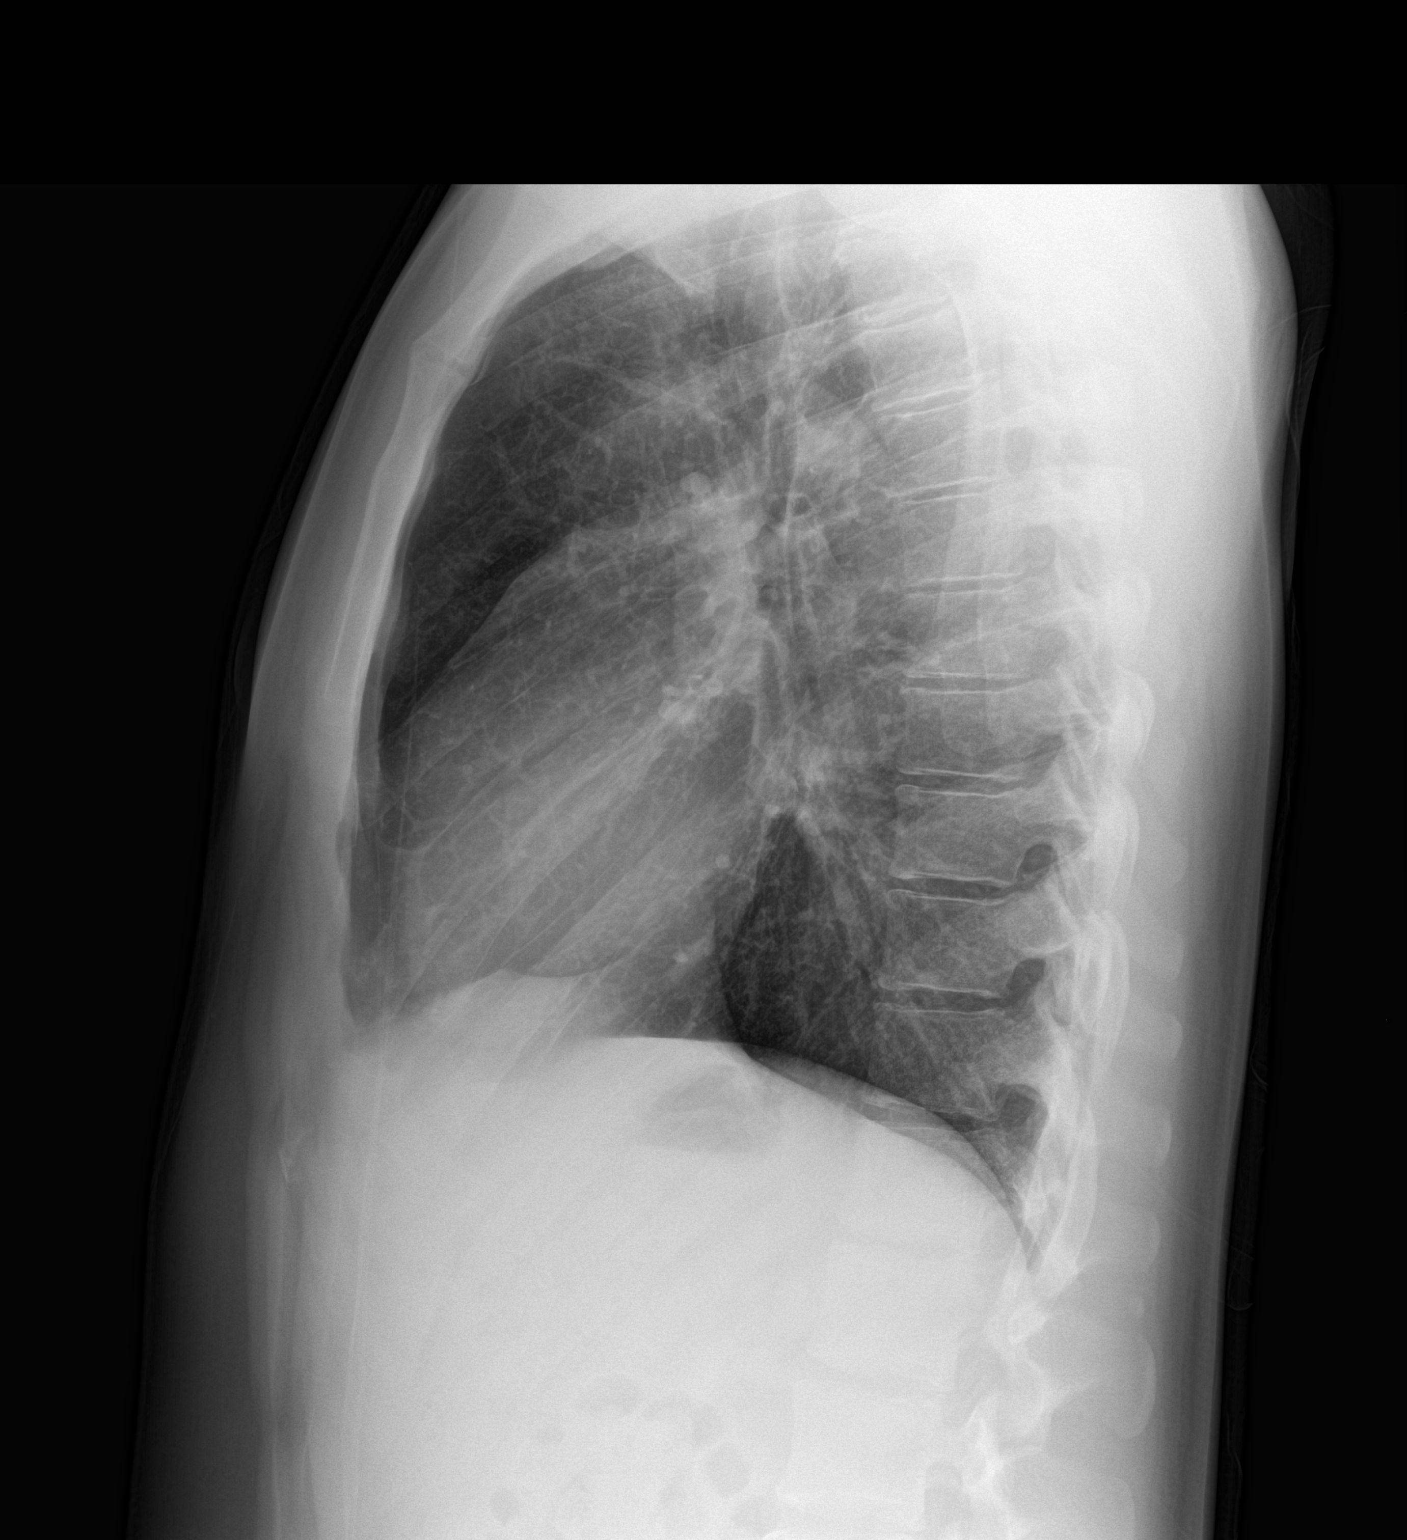

[2 of 2 positions shown; findings below may reference images not displayed]

FINDINGS: Mild hyperinflation interstitial changes compatible with history of
asthma. No focal airspace disease or consolidation in the lungs. No
blunting of costophrenic angles. No pneumothorax. Normal heart size
and pulmonary vascularity.
IMPRESSION: No active cardiopulmonary disease.

## 2018-04-10 ENCOUNTER — Emergency Department (HOSPITAL_COMMUNITY)
Admission: EM | Admit: 2018-04-10 | Discharge: 2018-04-10 | Disposition: A | Discharge status: Home / Self Care | Payer: Self-pay | Attending: Emergency Medicine | Admitting: Emergency Medicine

## 2018-04-10 ENCOUNTER — Encounter (HOSPITAL_COMMUNITY): Payer: Self-pay | Admitting: Emergency Medicine

## 2018-04-10 DIAGNOSIS — Z79899 Other long term (current) drug therapy: Secondary | ICD-10-CM | POA: Insufficient documentation

## 2018-04-10 DIAGNOSIS — J45909 Unspecified asthma, uncomplicated: Secondary | ICD-10-CM | POA: Insufficient documentation

## 2018-04-10 DIAGNOSIS — L0291 Cutaneous abscess, unspecified: Secondary | ICD-10-CM

## 2018-04-10 DIAGNOSIS — N611 Abscess of the breast and nipple: Secondary | ICD-10-CM | POA: Insufficient documentation

## 2018-04-10 DIAGNOSIS — F17210 Nicotine dependence, cigarettes, uncomplicated: Secondary | ICD-10-CM | POA: Insufficient documentation

## 2018-04-10 MED ORDER — CEPHALEXIN 500 MG PO CAPS: 500.0000 mg | ORAL_CAPSULE | Freq: Four times a day (QID) | ORAL | 0 refills | Status: DC

## 2018-04-10 MED ORDER — SULFAMETHOXAZOLE-TRIMETHOPRIM 800-160 MG PO TABS: 1.0000 | ORAL_TABLET | Freq: Two times a day (BID) | ORAL | 0 refills | Status: AC

## 2018-04-10 MED ORDER — SULFAMETHOXAZOLE-TRIMETHOPRIM 800-160 MG PO TABS: 1.0000 | ORAL_TABLET | Freq: Two times a day (BID) | ORAL | 0 refills | Status: DC

## 2018-04-10 MED ORDER — ALBUTEROL SULFATE HFA 108 (90 BASE) MCG/ACT IN AERS
2.0000 | INHALATION_SPRAY | Freq: Once | RESPIRATORY_TRACT | Status: AC
  Administered 2018-04-10: 2 via RESPIRATORY_TRACT
  Filled 2018-04-10: qty 6.7

## 2018-04-10 NOTE — Discharge Instructions (Signed)
Keflex and Bactrim as prescribed.  Apply warm soaks as frequently as possible for the next several days.  Return to the emergency department if you develop worsening swelling, pain, high fever, or other new and concerning symptoms.

## 2018-04-10 NOTE — ED Provider Notes (Signed)
[facility] EMERGENCY DEPARTMENT Provider Note   CSN: [id number] Arrival date & time: 04/10/18  2111     History   Chief Complaint Chief Complaint  Patient presents with  . Abscess    HPI Cory Adams is a 27 y.o. male.  Patient is a 27 year old male with history of asthma presenting with complaints of swelling and pain under his left nipple.  This started several days ago.  He believes he may have been bitten 2 days prior by a spider.  This has been draining purulent material intermittently for the past 24 hours.  He denies any fevers or chills.  The history is provided by the patient.  Abscess  Abscess location: Left chest. Abscess quality: draining, induration, painful and redness   Abscess quality: no fluctuance   Duration:  3 days Progression:  Worsening Pain details:    Severity:  Mild   Timing:  Constant   Progression:  Improving Context: not diabetes   Relieved by:  Nothing Worsened by:  Nothing   Past Medical History:  Diagnosis Date  . Asthma   . Seasonal allergies     There are no active problems to display for this patient.   History reviewed. No pertinent surgical history.      Home Medications    Prior to Admission medications   Medication Sig Start Date End Date Taking? Authorizing Provider  albuterol (PROVENTIL HFA;VENTOLIN HFA) 108 (90 BASE) MCG/ACT inhaler Inhale 1-2 puffs into the lungs every 6 (six) hours as needed for wheezing or shortness of breath. 08/12/14   Emilia Beck, PA-C  HYDROcodone-acetaminophen (NORCO/VICODIN) 5-325 MG tablet Take 1-2 tablets by mouth every 6 hours as needed for pain and/or cough. 06/28/15   Pisciotta, Mardella Layman    Family History No family history on file.  Social History Social History   Tobacco Use  . Smoking status: Current Every Day Smoker    Packs/day: 0.50  . Smokeless tobacco: Never Used  Substance Use Topics  . Alcohol use: Yes    Comment: Socially   . Drug  use: No     Allergies   Patient has no known allergies.   Review of Systems Review of Systems  All other systems reviewed and are negative.    Physical Exam Updated Vital Signs BP 130/77 (BP Location: Right Arm)   Pulse 98   Temp 99.1 F (37.3 C) (Oral)   Resp 18   Ht 6' (1.829 m)   Wt 95.3 kg   SpO2 100%   BMI 28.48 kg/m   Physical Exam  Constitutional: He is oriented to person, place, and time. He appears well-developed and well-nourished. No distress.  HENT:  Head: Normocephalic and atraumatic.  Neck: Normal range of motion. Neck supple.  Cardiovascular: Normal rate and regular rhythm.  No murmur heard. Pulmonary/Chest: Effort normal and breath sounds normal. No stridor. No respiratory distress. He has no rales.  Neurological: He is alert and oriented to person, place, and time.  Skin: He is not diaphoretic.  There is a small area of induration located just below the left nipple.  There is slight dried drainage noted.  There is no fluctuance.  There is mild surrounding erythema.  Nursing note and vitals reviewed.    ED Treatments / Results  Labs (all labs ordered are listed, but only abnormal results are displayed) Labs Reviewed - No data to display  EKG None  Radiology No results found.  Procedures Procedures (including critical care time)  Medications Ordered in ED Medications - No data to display   Initial Impression / Assessment and Plan / ED Course  I have reviewed the triage vital signs and the nursing notes.  Pertinent labs & imaging results that were available during my care of the patient were reviewed by me and considered in my medical decision making (see chart for details).  This represents a small abscess that appears to be draining spontaneously.  He will be treated with Keflex and Bactrim.  He also states he has had a cough and believes his asthma is flaring up.  I will prescribe an albuterol MDI.  His lungs are otherwise clear no  respiratory distress.  Final Clinical Impressions(s) / ED Diagnoses   Final diagnoses:  None    ED Discharge Orders    None       Geoffery Lyons, MD 04/10/18 2322

## 2018-04-10 NOTE — ED Triage Notes (Signed)
Pt presents with abscess to L chest area under nipple. Pt states he believes he was bit by a spider last week, the area started to swell and drain.

## 2018-07-03 ENCOUNTER — Ambulatory Visit (HOSPITAL_COMMUNITY)
Admission: EM | Admit: 2018-07-03 | Discharge: 2018-07-03 | Disposition: A | Discharge status: Home / Self Care | Payer: Self-pay | Attending: Family Medicine | Admitting: Family Medicine

## 2018-07-03 ENCOUNTER — Encounter (HOSPITAL_COMMUNITY): Payer: Self-pay | Admitting: *Deleted

## 2018-07-03 ENCOUNTER — Other Ambulatory Visit: Payer: Self-pay

## 2018-07-03 DIAGNOSIS — R69 Illness, unspecified: Secondary | ICD-10-CM | POA: Insufficient documentation

## 2018-07-03 DIAGNOSIS — J111 Influenza due to unidentified influenza virus with other respiratory manifestations: Secondary | ICD-10-CM

## 2018-07-03 MED ORDER — ACETAMINOPHEN 325 MG PO TABS
650.0000 mg | ORAL_TABLET | Freq: Once | ORAL | Status: AC
  Administered 2018-07-03: 650 mg via ORAL

## 2018-07-03 MED ORDER — ALBUTEROL SULFATE HFA 108 (90 BASE) MCG/ACT IN AERS: 1.0000 | INHALATION_SPRAY | Freq: Four times a day (QID) | RESPIRATORY_TRACT | 0 refills | Status: AC | PRN

## 2018-07-03 MED ORDER — OSELTAMIVIR PHOSPHATE 75 MG PO CAPS: 75.0000 mg | ORAL_CAPSULE | Freq: Two times a day (BID) | ORAL | 0 refills | Status: AC

## 2018-07-03 MED ORDER — IBUPROFEN 400 MG PO TABS: 400.0000 mg | ORAL_TABLET | Freq: Four times a day (QID) | ORAL | 0 refills | Status: AC | PRN

## 2018-07-03 NOTE — ED Triage Notes (Signed)
Started 3 days ago with cough and tactile fever.

## 2018-07-03 NOTE — Discharge Instructions (Signed)
It was nice seeing you. I feel you have flu like symptoms. Given exposure to someone with flu, I will treat you for flu. Use medication as instructed and Ibuprofen as needed for fever.  Continue home asthma medications. Please go to the ED should you develop SOB, chest pain, worsening of cough.

## 2018-07-03 NOTE — ED Provider Notes (Addendum)
[facility]    CSN: [id number] Arrival date & time: 07/03/18  1705     History   Chief Complaint Chief Complaint  Patient presents with  . Fever  . Cough    HPI Cory Adams is a 27 y.o. male.   The history is provided by the patient. No language interpreter was used.  Fever  Temp source:  Subjective Onset quality:  Gradual Duration:  2 days Timing:  Constant Progression:  Waxing and waning Chronicity:  New Worsened by:  Nothing Ineffective treatments: Dayquil. Associated symptoms: cough and headaches   Associated symptoms: no chest pain, no chills, no diarrhea, no dysuria, no nausea, no rhinorrhea and no vomiting   Associated symptoms comment:  Cough started a day before his fever Cough  Cough characteristics:  Productive Sputum characteristics:  Yellow and white Severity:  Moderate Onset quality:  Gradual Duration:  3 days Timing:  Intermittent Progression:  Unchanged Chronicity:  New Smoker: yes   Context: sick contacts   Context: not occupational exposure   Context comment:  His girl friend is sick and his son had the flu Relieved by:  Nothing Ineffective treatments:  Cough suppressants Associated symptoms: fever and headaches   Associated symptoms: no chest pain, no chills, no rhinorrhea and no shortness of breath   He did not get his flu shot. He is using his inhaler more although no SON  Past Medical History:  Diagnosis Date  . Asthma   . Seasonal allergies     There are no active problems to display for this patient.   History reviewed. No pertinent surgical history.     Home Medications    Prior to Admission medications   Medication Sig Start Date End Date Taking? Authorizing Provider  albuterol (PROVENTIL HFA;VENTOLIN HFA) 108 (90 BASE) MCG/ACT inhaler Inhale 1-2 puffs into the lungs every 6 (six) hours as needed for wheezing or shortness of breath. 08/12/14   Emilia BeckSzekalski, Kaitlyn, PA-C  cephALEXin (KEFLEX) 500 MG capsule  Take 1 capsule (500 mg total) by mouth 4 (four) times daily. 04/10/18   Geoffery Lyonselo, Douglas, MD  HYDROcodone-acetaminophen (NORCO/VICODIN) 5-325 MG tablet Take 1-2 tablets by mouth every 6 hours as needed for pain and/or cough. 06/28/15   Pisciotta, Mardella LaymanNicole, PA-C    Family History History reviewed. No pertinent family history.  Social History Social History   Tobacco Use  . Smoking status: Current Every Day Smoker    Packs/day: 0.50    Types: Cigarettes  . Smokeless tobacco: Never Used  Substance Use Topics  . Alcohol use: Yes    Comment: Socially   . Drug use: No     Allergies   Patient has no known allergies.   Review of Systems Review of Systems  Constitutional: Positive for fever. Negative for chills.  HENT: Negative for rhinorrhea.   Respiratory: Positive for cough. Negative for shortness of breath.   Cardiovascular: Negative for chest pain.  Gastrointestinal: Negative for diarrhea, nausea and vomiting.  Genitourinary: Negative for dysuria.  Neurological: Positive for headaches.  All other systems reviewed and are negative.    Physical Exam Triage Vital Signs ED Triage Vitals  Enc Vitals Group     BP 07/03/18 1808 124/76     Pulse Rate 07/03/18 1808 (!) 109     Resp 07/03/18 1808 16     Temp 07/03/18 1808 (!) 101.3 F (38.5 C)     Temp Source 07/03/18 1808 Oral     SpO2 07/03/18 1808 98 %  Weight --      Height --      Head Circumference --      Peak Flow --      Pain Score 07/03/18 1809 7     Pain Loc --      Pain Edu? --      Excl. in GC? --    No data found.  Updated Vital Signs BP 124/76   Pulse (!) 109   Temp (!) 101.3 F (38.5 C) (Oral)   Resp 16   SpO2 98%   Visual Acuity Right Eye Distance:   Left Eye Distance:   Bilateral Distance:    Right Eye Near:   Left Eye Near:    Bilateral Near:     Physical Exam Vitals signs and nursing note reviewed.  Constitutional:      General: He is not in acute distress.    Appearance: He is not  ill-appearing or diaphoretic.  HENT:     Head: Normocephalic.     Right Ear: Tympanic membrane and ear canal normal.     Left Ear: Tympanic membrane and ear canal normal.     Mouth/Throat:     Mouth: Mucous membranes are moist.     Tongue: No lesions.     Pharynx: Oropharynx is clear.     Tonsils: No tonsillar exudate or tonsillar abscesses.  Neck:     Musculoskeletal: Normal range of motion. No neck rigidity or muscular tenderness.  Cardiovascular:     Rate and Rhythm: Normal rate and regular rhythm.     Heart sounds: No murmur.  Pulmonary:     Effort: Pulmonary effort is normal. No respiratory distress.     Breath sounds: No stridor. Wheezing present. No rhonchi.     Comments: Faint expiratory wheeze Abdominal:     General: Abdomen is flat. Bowel sounds are normal. There is no distension.     Tenderness: There is no abdominal tenderness.  Lymphadenopathy:     Cervical: No cervical adenopathy.  Neurological:     General: No focal deficit present.     Mental Status: He is alert and oriented to person, place, and time.      UC Treatments / Results  Labs (all labs ordered are listed, but only abnormal results are displayed) Labs Reviewed - No data to display  EKG None  Radiology No results found.  Procedures Procedures (including critical care time)  Medications Ordered in UC Medications  acetaminophen (TYLENOL) tablet 650 mg (has no administration in time range)    Initial Impression / Assessment and Plan / UC Course  I have reviewed the triage vital signs and the nursing notes.  Pertinent labs & imaging results that were available during my care of the patient were reviewed by me and considered in my medical decision making (see chart for details).  Clinical Course as of Jul 03 1833  Wynelle Link Jul 03, 2018  1827 Influenza like illness. + Exposure to someone with flu. Ibuprofen and Tamiflu prescribed.  Tylenol given during this visit. Return precaution discussed.    [KE]  1828 Asthma. No acute flare. Continue home regimen. ED precaution discussed. I refilled his meds.   [KE]    Clinical Course User Index [KE] Doreene Eland, MD    Influenza-like illness   Final Clinical Impressions(s) / UC Diagnoses   Final diagnoses:  None   Discharge Instructions   None    ED Prescriptions    None     Controlled Substance  Prescriptions [place] Controlled Substance Registry consulted? Not Applicable   Doreene ElandEniola, Aunesti Pellegrino T, MD 07/03/18 1831    Doreene ElandEniola, Jevonte Clanton T, MD 07/03/18 [phone]

## 2018-07-07 ENCOUNTER — Encounter (HOSPITAL_COMMUNITY): Payer: Self-pay | Admitting: *Deleted

## 2018-07-07 ENCOUNTER — Other Ambulatory Visit: Payer: Self-pay

## 2018-07-07 ENCOUNTER — Emergency Department (HOSPITAL_COMMUNITY)
Admission: EM | Admit: 2018-07-07 | Discharge: 2018-07-07 | Disposition: A | Discharge status: Home / Self Care | Payer: Self-pay | Attending: Emergency Medicine | Admitting: Emergency Medicine

## 2018-07-07 DIAGNOSIS — J45909 Unspecified asthma, uncomplicated: Secondary | ICD-10-CM | POA: Insufficient documentation

## 2018-07-07 DIAGNOSIS — F17210 Nicotine dependence, cigarettes, uncomplicated: Secondary | ICD-10-CM | POA: Insufficient documentation

## 2018-07-07 DIAGNOSIS — L02415 Cutaneous abscess of right lower limb: Secondary | ICD-10-CM | POA: Insufficient documentation

## 2018-07-07 MED ORDER — CEPHALEXIN 500 MG PO CAPS: 500.0000 mg | ORAL_CAPSULE | Freq: Two times a day (BID) | ORAL | 0 refills | Status: AC
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
## 2018-07-07 NOTE — ED Provider Notes (Signed)
[facility]-EMERGENCY DEPT Provider Note   CSN: [id number] Arrival date & time: 07/07/18  2052     History   Chief Complaint Chief Complaint  Patient presents with  . Abscess    RLE    HPI Cory Adams is a 28 y.o. male past medical history of asthma, presenting to the emergency department with complaint of swelling and redness to his right lower leg began a couple days ago.  States he initially thought it was a spider bite with a small, however swelling increased.  Patient did not actually see a spider bite him.  No fevers or chills.  No interventions tried.  The history is provided by the patient.    Past Medical History:  Diagnosis Date  . Asthma   . Seasonal allergies     There are no active problems to display for this patient.   History reviewed. No pertinent surgical history.      Home Medications    Prior to Admission medications   Medication Sig Start Date End Date Taking? Authorizing Provider  albuterol (PROVENTIL HFA;VENTOLIN HFA) 108 (90 Base) MCG/ACT inhaler Inhale 1-2 puffs into the lungs every 6 (six) hours as needed for wheezing or shortness of breath. 07/03/18  Yes Doreene Eland, MD  ibuprofen (ADVIL,MOTRIN) 400 MG tablet Take 1 tablet (400 mg total) by mouth every 6 (six) hours as needed for up to 6 days. 07/03/18 07/09/18 Yes Doreene Eland, MD  oseltamivir (TAMIFLU) 75 MG capsule Take 1 capsule (75 mg total) by mouth every 12 (twelve) hours for 5 days. 07/03/18 07/08/18 Yes Doreene Eland, MD  cephALEXin (KEFLEX) 500 MG capsule Take 1 capsule (500 mg total) by mouth 2 (two) times daily for 5 days. 07/07/18 07/12/18  , [place] N, PA-C  HYDROcodone-acetaminophen (NORCO/VICODIN) 5-325 MG tablet Take 1-2 tablets by mouth every 6 hours as needed for pain and/or cough. Patient not taking: Reported on 07/07/2018 06/28/15   Pisciotta, Joni Reining, PA-C    Family History No family history on file.  Social History Social History     Tobacco Use  . Smoking status: Current Every Day Smoker    Packs/day: 0.50    Types: Cigarettes  . Smokeless tobacco: Never Used  Substance Use Topics  . Alcohol use: Yes    Comment: Socially   . Drug use: No     Allergies   Patient has no known allergies.   Review of Systems Review of Systems  Constitutional: Negative for chills and fever.  Skin: Positive for color change.  Allergic/Immunologic: Negative for immunocompromised state.     Physical Exam Updated Vital Signs BP 129/82 (BP Location: Right Arm)   Pulse 74   Temp 98.5 F (36.9 C) (Oral)   Resp 15   Ht 6\' 1"  (1.854 m)   Wt 85.7 kg   SpO2 99%   BMI 24.94 kg/m   Physical Exam Vitals signs and nursing note reviewed.  Constitutional:      Appearance: He is well-developed. He is not ill-appearing.  HENT:     Head: Normocephalic and atraumatic.  Eyes:     Conjunctiva/sclera: Conjunctivae normal.  Cardiovascular:     Rate and Rhythm: Normal rate.  Pulmonary:     Effort: Pulmonary effort is normal.  Skin:    Comments: Right anterior lower leg with fluctuant abscess about 1.5 cm in diameter.  There is surrounding erythema extending about 4 cm from the abscess with assoc warmth, erythema is not circumferential to  the leg. Not actively draining. Intact pulses and sensation.   Neurological:     Mental Status: He is alert.  Psychiatric:        Mood and Affect: Mood normal.        Behavior: Behavior normal.      ED Treatments / Results  Labs (all labs ordered are listed, but only abnormal results are displayed) Labs Reviewed - No data to display  EKG None  Radiology No results found.  Procedures Procedures (including critical care time)  Medications Ordered in ED Medications - No data to display   Initial Impression / Assessment and Plan / ED Course  I have reviewed the triage vital signs and the nursing notes.  Pertinent labs & imaging results that were available during my care of the  patient were reviewed by me and considered in my medical decision making (see chart for details).     Patient with skin abscess to the right lower leg with mild signs of surrounding cellulitis.  No systemic symptoms, afebrile.  Recommend incision and drainage, however patient declined this procedure and requesting conservative measures.  No history of immunocompromise.  Patient discharged with instruction to apply warm compresses/warm water soaks multiple times per day.  Keflex.  Outpatient follow-up.  Safe for discharge.  Discussed results, findings, treatment and follow up. Patient advised of return precautions. Patient verbalized understanding and agreed with plan.   Final Clinical Impressions(s) / ED Diagnoses   Final diagnoses:  Abscess of right lower leg    ED Discharge Orders         Ordered    cephALEXin (KEFLEX) 500 MG capsule  2 times daily     07/07/18 2238           , [place] N, PA-C 07/07/18 2244    Cathren Laine, MD 07/07/18 2251

## 2018-07-07 NOTE — Discharge Instructions (Signed)
Please read instructions below.  Soak or apply a warm compress to your leg multiple times per day to encourage it to drain.  You can take Advil/ibuprofen every 6 hours as needed for pain. Follow up with your primary care or urgent care for wound recheck in 2 days.  Return to the ER for fever, worsening redness, or new or worsening symptoms.

## 2018-07-24 ENCOUNTER — Other Ambulatory Visit (HOSPITAL_COMMUNITY): Payer: Self-pay | Admitting: Family Medicine

## 2019-05-27 ENCOUNTER — Emergency Department (HOSPITAL_COMMUNITY)
Admission: EM | Admit: 2019-05-27 | Discharge: 2019-05-27 | Disposition: A | Discharge status: Home / Self Care | Payer: Self-pay | Attending: Emergency Medicine | Admitting: Emergency Medicine

## 2019-05-27 ENCOUNTER — Other Ambulatory Visit: Payer: Self-pay

## 2019-05-27 ENCOUNTER — Encounter (HOSPITAL_COMMUNITY): Payer: Self-pay | Admitting: Obstetrics and Gynecology

## 2019-05-27 DIAGNOSIS — M545 Low back pain, unspecified: Secondary | ICD-10-CM

## 2019-05-27 DIAGNOSIS — J45909 Unspecified asthma, uncomplicated: Secondary | ICD-10-CM | POA: Insufficient documentation

## 2019-05-27 DIAGNOSIS — F17210 Nicotine dependence, cigarettes, uncomplicated: Secondary | ICD-10-CM | POA: Insufficient documentation

## 2019-05-27 MED ORDER — METHOCARBAMOL 500 MG PO TABS: 500.0000 mg | ORAL_TABLET | Freq: Two times a day (BID) | ORAL | 0 refills | Status: AC
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
## 2019-05-27 NOTE — Discharge Instructions (Signed)
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
## 2020-05-14 ENCOUNTER — Ambulatory Visit (INDEPENDENT_AMBULATORY_CARE_PROVIDER_SITE_OTHER): Payer: Self-pay

## 2020-05-14 ENCOUNTER — Ambulatory Visit (HOSPITAL_COMMUNITY)
Admission: EM | Admit: 2020-05-14 | Discharge: 2020-05-14 | Disposition: A | Discharge status: Home / Self Care | Payer: Self-pay | Attending: Physician Assistant | Admitting: Physician Assistant

## 2020-05-14 ENCOUNTER — Encounter (HOSPITAL_COMMUNITY): Payer: Self-pay | Admitting: Emergency Medicine

## 2020-05-14 ENCOUNTER — Other Ambulatory Visit: Payer: Self-pay

## 2020-05-14 DIAGNOSIS — L03115 Cellulitis of right lower limb: Secondary | ICD-10-CM

## 2020-05-14 DIAGNOSIS — M79661 Pain in right lower leg: Secondary | ICD-10-CM

## 2020-05-14 LAB — CBG MONITORING, ED: Glucose-Capillary: 111 mg/dL — ABNORMAL HIGH (ref 70–99)

## 2020-05-14 MED ORDER — SULFAMETHOXAZOLE-TRIMETHOPRIM 800-160 MG PO TABS: 1.0000 | ORAL_TABLET | Freq: Two times a day (BID) | ORAL | 0 refills | Status: AC

## 2020-05-14 MED ORDER — LIDOCAINE HCL (PF) 1 % IJ SOLN
INTRAMUSCULAR | Status: AC
  Filled 2020-05-14: qty 2

## 2020-05-14 MED ORDER — CEFTRIAXONE SODIUM 1 G IJ SOLR
1.0000 g | Freq: Once | INTRAMUSCULAR | Status: AC
  Administered 2020-05-14: 1 g via INTRAMUSCULAR

## 2020-05-14 MED ORDER — CEFTRIAXONE SODIUM 1 G IJ SOLR
INTRAMUSCULAR | Status: AC
  Filled 2020-05-14: qty 10

## 2020-05-14 NOTE — Discharge Instructions (Addendum)
Return if any problems.  Return here tomorrow for recheck.
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
## 2020-05-15 NOTE — ED Provider Notes (Signed)
[facility]    CSN: [id number] Arrival date & time: 05/14/20  1503      History   Chief Complaint Chief Complaint  Patient presents with  . Leg Injury    HPI Cory Adams is a 29 y.o. male.   The history is provided by the patient. No language interpreter was used.  Leg Pain Location:  Leg Time since incident:  2 weeks Injury: yes   Leg location:  R leg Pain details:    Quality:  Aching   Radiates to:  Does not radiate   Severity:  Moderate   Onset quality:  Gradual   Timing:  Constant   Progression:  Worsening Chronicity:  New Foreign body present:  No foreign bodies Prior injury to area:  Yes Ineffective treatments:  None tried Pt reports he hit leg on a trailer hitch and then hit again 3 days ago.  Pt reports area is now swollen and draining  Past Medical History:  Diagnosis Date  . Asthma   . Seasonal allergies     There are no problems to display for this patient.   History reviewed. No pertinent surgical history.     Home Medications    Prior to Admission medications   Medication Sig Start Date End Date Taking? Authorizing Provider  albuterol (PROVENTIL HFA;VENTOLIN HFA) 108 (90 Base) MCG/ACT inhaler Inhale 1-2 puffs into the lungs every 6 (six) hours as needed for wheezing or shortness of breath. 07/03/18   Doreene Eland, MD  HYDROcodone-acetaminophen (NORCO/VICODIN) 5-325 MG tablet Take 1-2 tablets by mouth every 6 hours as needed for pain and/or cough. Patient not taking: Reported on 07/07/2018 06/28/15   Pisciotta, Joni Reining, PA-C  sulfamethoxazole-trimethoprim (BACTRIM DS) 800-160 MG tablet Take 1 tablet by mouth 2 (two) times daily. 05/14/20   Elson Areas, PA-C    Family History History reviewed. No pertinent family history.  Social History Social History   Tobacco Use  . Smoking status: Current Every Day Smoker    Packs/day: 0.50    Years: 7.00    Pack years: 3.50    Types: Cigarettes  . Smokeless tobacco: Never  Used  Vaping Use  . Vaping Use: Never used  Substance Use Topics  . Alcohol use: Yes    Comment: Socially   . Drug use: No     Allergies   Patient has no known allergies.   Review of Systems Review of Systems  All other systems reviewed and are negative.    Physical Exam Triage Vital Signs ED Triage Vitals  Enc Vitals Group     BP 05/14/20 1615 127/74     Pulse Rate 05/14/20 1615 84     Resp 05/14/20 1615 18     Temp 05/14/20 1615 98.3 F (36.8 C)     Temp Source 05/14/20 1615 Oral     SpO2 05/14/20 1615 98 %     Weight --      Height --      Head Circumference --      Peak Flow --      Pain Score 05/14/20 1614 8     Pain Loc --      Pain Edu? --      Excl. in GC? --    No data found.  Updated Vital Signs BP 127/74 (BP Location: Left Arm)   Pulse 84   Temp 98.3 F (36.8 C) (Oral)   Resp 18   SpO2 98%   Visual Acuity  Right Eye Distance:   Left Eye Distance:   Bilateral Distance:    Right Eye Near:   Left Eye Near:    Bilateral Near:     Physical Exam Vitals and nursing note reviewed.  Constitutional:      Appearance: He is well-developed.  HENT:     Head: Normocephalic and atraumatic.  Eyes:     Conjunctiva/sclera: Conjunctivae normal.  Cardiovascular:     Rate and Rhythm: Normal rate and regular rhythm.     Heart sounds: No murmur heard.   Pulmonary:     Effort: Pulmonary effort is normal. No respiratory distress.     Breath sounds: Normal breath sounds.  Abdominal:     Palpations: Abdomen is soft.     Tenderness: There is no abdominal tenderness.  Musculoskeletal:        General: Swelling and tenderness present.     Cervical back: Neck supple.     Comments: Open oozing area mid shin,  Erythema and swelling lower leg  Skin:    General: Skin is warm and dry.  Neurological:     General: No focal deficit present.     Mental Status: He is alert.  Psychiatric:        Mood and Affect: Mood normal.      UC Treatments / Results    Labs (all labs ordered are listed, but only abnormal results are displayed) Labs Reviewed  CBG MONITORING, ED - Abnormal; Notable for the following components:      Result Value   Glucose-Capillary 111 (*)    All other components within normal limits    EKG   Radiology DG Tibia/Fibula Right  Result Date: 05/14/2020 CLINICAL DATA:  Right lower leg pain. EXAM: RIGHT TIBIA AND FIBULA - 2 VIEW COMPARISON:  None. FINDINGS: There is no evidence of fracture or other focal bone lesions. Soft tissues are unremarkable. IMPRESSION: Negative. Electronically Signed   By: Lupita Raider M.D.   On: 05/14/2020 16:57    Procedures Procedures (including critical care time)  Medications Ordered in UC Medications  cefTRIAXone (ROCEPHIN) injection 1 g (1 g Intramuscular Given 05/14/20 1724)    Initial Impression / Assessment and Plan / UC Course  I have reviewed the triage vital signs and the nursing notes.  Pertinent labs & imaging results that were available during my care of the patient were reviewed by me and considered in my medical decision making (see chart for details).     MDM:  Pt given Rocephin IM.  Rx for Bactrim.  Pt to recheck here in am with me.  Final Clinical Impressions(s) / UC Diagnoses   Final diagnoses:  Cellulitis of leg, right     Discharge Instructions     Return if any problems.  Return here tomorrow for recheck.    ED Prescriptions    Medication Sig Dispense Auth. Provider   sulfamethoxazole-trimethoprim (BACTRIM DS) 800-160 MG tablet Take 1 tablet by mouth 2 (two) times daily. 20 tablet Elson Areas, [place]     PDMP not reviewed this encounter.  An After Visit Summary was printed and given to the patient.    Elson Areas, [place] 05/15/20 [phone]

## 2021-05-17 IMAGING — DX DG TIBIA/FIBULA 2V*R*
2 series · 2 of 2 positions shown · non-contrast
Comparison: None.

CLINICAL DATA: Right lower leg pain.

EXAM:
RIGHT TIBIA AND FIBULA - 2 VIEW

[tibia ap]
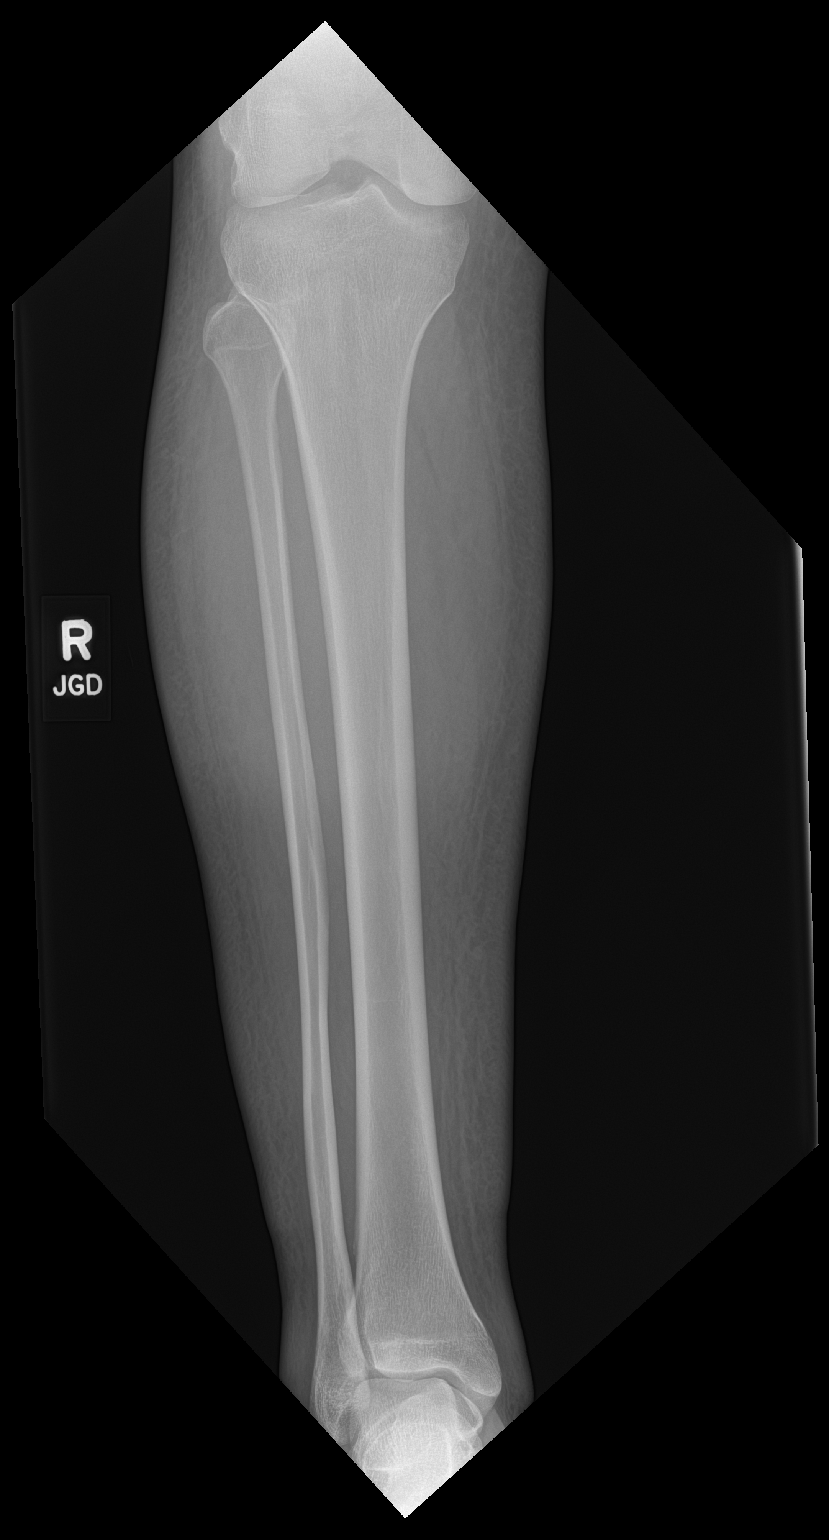

[tibia lat]
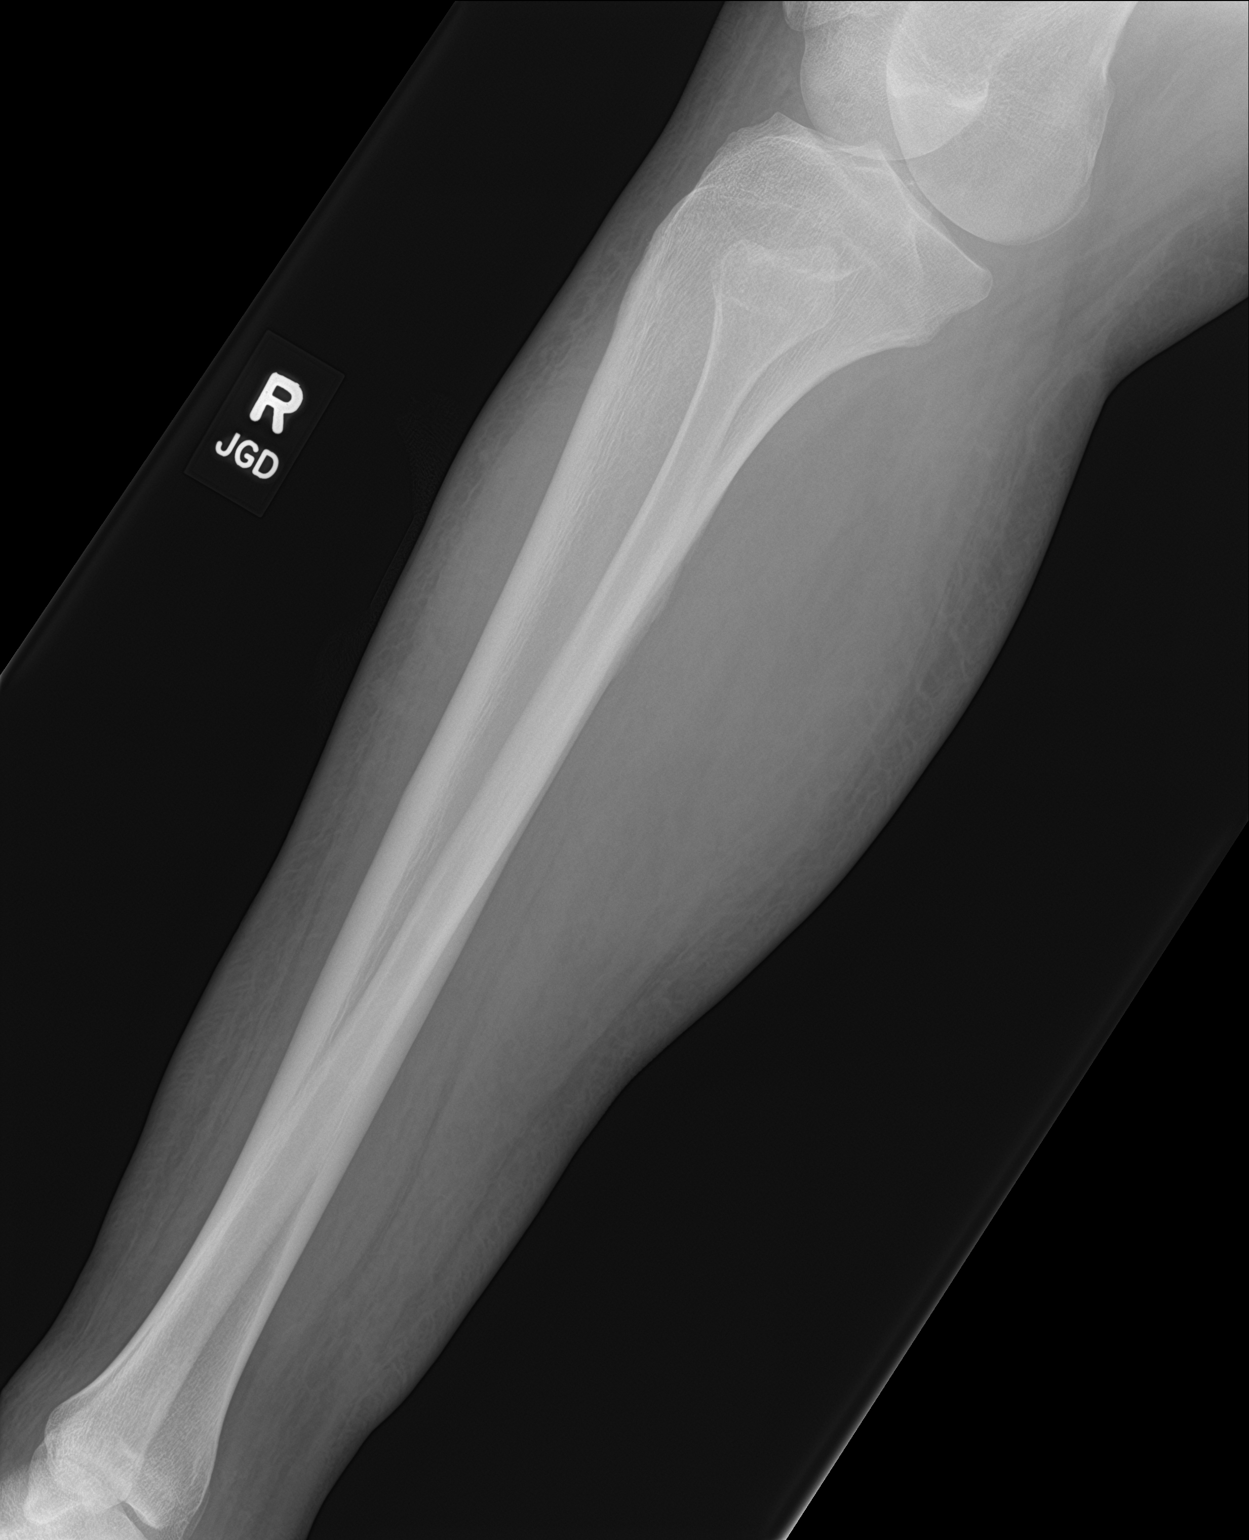

[2 of 2 positions shown; findings below may reference images not displayed]

FINDINGS: There is no evidence of fracture or other focal bone lesions. Soft
tissues are unremarkable.
IMPRESSION: Negative.

## 2022-02-20 ENCOUNTER — Encounter (HOSPITAL_COMMUNITY): Payer: Self-pay

## 2022-02-20 ENCOUNTER — Ambulatory Visit (HOSPITAL_COMMUNITY)
Admission: EM | Admit: 2022-02-20 | Discharge: 2022-02-20 | Disposition: A | Discharge status: Home / Self Care | Payer: 59 | Attending: Physician Assistant | Admitting: Physician Assistant

## 2022-02-20 DIAGNOSIS — T162XXA Foreign body in left ear, initial encounter: Secondary | ICD-10-CM

## 2022-02-20 MED ORDER — NEOMYCIN-POLYMYXIN-HC 3.5-10000-1 OT SUSP: 4.0000 [drp] | Freq: Three times a day (TID) | OTIC | 0 refills | Status: AC

## 2022-02-20 MED ORDER — LIDOCAINE VISCOUS HCL 2 % MT SOLN
OROMUCOSAL | Status: AC
  Filled 2022-02-20: qty 15

## 2022-02-20 NOTE — Discharge Instructions (Signed)
Do not use qtips or put anything in the ear May experience a little discomfort If it does not resolve return for evaluation

## 2022-02-20 NOTE — ED Provider Notes (Signed)
[facility]    CSN: [id number] Arrival date & time: 02/20/22  1724      History   Chief Complaint Chief Complaint  Patient presents with   Ear Problem    HPI Cory Adams is a 31 y.o. male.   Pt reports he was driving in his car with the window down when an insect flew into his ear about 20 min before arriving.  He reports putting peroxide in the ear and trying to flush it out.  He reports he can feel it moving around.  Reports discomfort.     Past Medical History:  Diagnosis Date   Asthma    Seasonal allergies     There are no problems to display for this patient.   History reviewed. No pertinent surgical history.     Home Medications    Prior to Admission medications   Medication Sig Start Date End Date Taking? Authorizing Provider  neomycin-polymyxin-hydrocortisone (CORTISPORIN) 3.5-10000-1 OTIC suspension Place 4 drops into the left ear 3 (three) times daily. 02/20/22  Yes Ward, Tylene Fantasia, PA-C  albuterol (PROVENTIL HFA;VENTOLIN HFA) 108 (90 Base) MCG/ACT inhaler Inhale 1-2 puffs into the lungs every 6 (six) hours as needed for wheezing or shortness of breath. 07/03/18   Doreene Eland, MD  HYDROcodone-acetaminophen (NORCO/VICODIN) 5-325 MG tablet Take 1-2 tablets by mouth every 6 hours as needed for pain and/or cough. Patient not taking: Reported on 07/07/2018 06/28/15   Pisciotta, Joni Reining, PA-C  sulfamethoxazole-trimethoprim (BACTRIM DS) 800-160 MG tablet Take 1 tablet by mouth 2 (two) times daily. 05/14/20   Elson Areas, PA-C    Family History History reviewed. No pertinent family history.  Social History Social History   Tobacco Use   Smoking status: Every Day    Packs/day: 0.50    Years: 7.00    Total pack years: 3.50    Types: Cigarettes   Smokeless tobacco: Never  Vaping Use   Vaping Use: Never used  Substance Use Topics   Alcohol use: Yes    Comment: Socially    Drug use: No     Allergies   Patient has no known  allergies.   Review of Systems Review of Systems  Constitutional:  Negative for chills and fever.  HENT:  Negative for ear pain and sore throat.        Ear foreign body  Eyes:  Negative for pain and visual disturbance.  Respiratory:  Negative for cough and shortness of breath.   Cardiovascular:  Negative for chest pain and palpitations.  Gastrointestinal:  Negative for abdominal pain and vomiting.  Genitourinary:  Negative for dysuria and hematuria.  Musculoskeletal:  Negative for arthralgias and back pain.  Skin:  Negative for color change and rash.  Neurological:  Negative for seizures and syncope.  All other systems reviewed and are negative.    Physical Exam Triage Vital Signs ED Triage Vitals [02/20/22 1738]  Enc Vitals Group     BP 129/86     Pulse Rate 80     Resp 18     Temp 98.6 F (37 C)     Temp Source Oral     SpO2 98 %     Weight      Height      Head Circumference      Peak Flow      Pain Score      Pain Loc      Pain Edu?      Excl. in GC?  No data found.  Updated Vital Signs BP 129/86 (BP Location: Left Arm)   Pulse 80   Temp 98.6 F (37 C) (Oral)   Resp 18   SpO2 98%   Visual Acuity Right Eye Distance:   Left Eye Distance:   Bilateral Distance:    Right Eye Near:   Left Eye Near:    Bilateral Near:     Physical Exam Vitals and nursing note reviewed.  Constitutional:      General: He is not in acute distress.    Appearance: He is well-developed.  HENT:     Head: Normocephalic and atraumatic.     Ears:     Comments: Insect visualized in left ear canal Eyes:     Conjunctiva/sclera: Conjunctivae normal.  Cardiovascular:     Rate and Rhythm: Normal rate and regular rhythm.     Heart sounds: No murmur heard. Pulmonary:     Effort: Pulmonary effort is normal. No respiratory distress.     Breath sounds: Normal breath sounds.  Abdominal:     Palpations: Abdomen is soft.     Tenderness: There is no abdominal tenderness.   Musculoskeletal:        General: No swelling.     Cervical back: Neck supple.  Skin:    General: Skin is warm and dry.     Capillary Refill: Capillary refill takes less than 2 seconds.  Neurological:     Mental Status: He is alert.  Psychiatric:        Mood and Affect: Mood normal.      UC Treatments / Results  Labs (all labs ordered are listed, but only abnormal results are displayed) Labs Reviewed - No data to display  EKG   Radiology No results found.  Procedures Foreign Body Removal  Date/Time: 02/20/2022 6:40 PM  Performed by: Ward, Tylene Fantasia, PA-C Authorized by: Ward, Tylene Fantasia, PA-C   Consent:    Consent obtained:  Verbal   Consent given by:  Patient   Risks, benefits, and alternatives were discussed: yes     Risks discussed:  Incomplete removal   Alternatives discussed:  No treatment Universal protocol:    Procedure explained and questions answered to patient or proxy's satisfaction: yes   Location:    Location:  Ear   Ear location:  L ear Procedure type:    Procedure complexity:  Simple Post-procedure details:    Neurovascular status: intact   Comments:     Viscous lidocaine instilled for 10-15 min before flushing the ear.  Forceps used to remove an insect, possible bee.  Insect came out intact, TM visualized, slightly erythema no perforation noted.   (including critical care time)  Medications Ordered in UC Medications - No data to display  Initial Impression / Assessment and Plan / UC Course  I have reviewed the triage vital signs and the nursing notes.  Pertinent labs & imaging results that were available during my care of the patient were reviewed by me and considered in my medical decision making (see chart for details).     Left ear foreign body removed in clinic, small bee/flying insect.  TM with no perforation, slight redness and irritation.  Final Clinical Impressions(s) / UC Diagnoses   Final diagnoses:  Acute foreign body of left  ear, initial encounter     Discharge Instructions      Do not use qtips or put anything in the ear May experience a little discomfort If it does not resolve return  for evaluation    ED Prescriptions     Medication Sig Dispense Auth. Provider   neomycin-polymyxin-hydrocortisone (CORTISPORIN) 3.5-10000-1 OTIC suspension Place 4 drops into the left ear 3 (three) times daily. 10 mL Ward, Tylene Fantasia, PA-C      PDMP not reviewed this encounter.   Ward, Tylene Fantasia, PA-C 02/20/22 1842

## 2022-02-20 NOTE — ED Triage Notes (Signed)
Pt thinks he has a bug in his right ear.
